# Patient Record
Sex: Female | Born: 2005 | Hispanic: No | Marital: Single | Smoking: Never smoker
Health system: Western US, Academic
[De-identification: ages and names within clinical notes are randomized; demographics above are authoritative.]

## PROBLEM LIST (undated history)

## (undated) DIAGNOSIS — L259 Unspecified contact dermatitis, unspecified cause: Secondary | ICD-10-CM

## (undated) DIAGNOSIS — J302 Other seasonal allergic rhinitis: Secondary | ICD-10-CM

## (undated) DIAGNOSIS — J45909 Unspecified asthma, uncomplicated: Secondary | ICD-10-CM

## (undated) DIAGNOSIS — L2084 Intrinsic (allergic) eczema: Secondary | ICD-10-CM

## (undated) HISTORY — DX: Unspecified asthma, uncomplicated: J45.909

## (undated) HISTORY — DX: Intrinsic (allergic) eczema: L20.84

## (undated) HISTORY — PX: NO SURGICAL HISTORY: 101002

## (undated) HISTORY — DX: Unspecified contact dermatitis, unspecified cause: L25.9

## (undated) HISTORY — DX: Other seasonal allergic rhinitis: J30.2

---
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
## 2023-01-01 ENCOUNTER — Ambulatory Visit: Payer: BLUE CROSS/BLUE SHIELD | Attending: ALLERGY

## 2023-01-01 DIAGNOSIS — J309 Allergic rhinitis, unspecified: Secondary | ICD-10-CM | POA: Insufficient documentation

## 2023-01-01 DIAGNOSIS — J45909 Unspecified asthma, uncomplicated: Secondary | ICD-10-CM | POA: Insufficient documentation

## 2023-01-01 NOTE — Nursing Note (Signed)
Pre-Procedure Immunotherapy Checklist:    All emergency medications are available in Pyxis (EpiPen, Cetirizine, Albuterol) yes    Two patient Identifiers confirmed: yes    Patient has their own EpiPen? yes    Patient's Premed Plan:   Singulair taken today no   Antihistamine taken at least 30 minutes prior to appointment yes   Other Zyrtec    Today's health status:  Peak flow (if asthmatic): NA    Any increased asthma symptoms in the past week? In the past 24 hours? no    Any increased allergy symptoms in the past week? Itching eyes/nose, sneezing, runny nose, post-nasal drip, throat clearing: no     Any cold/respiratory tract infections, or flu like symptoms in the past week? no    Any increased allergy/asthma symptoms, difficulty breathing, hives, or generalized itching within 12 hours of receiving your last injection? no    Any new medication (antibiotic, beta blocker, ACE-inhibitor): no     Today's antigen bottle confirmed: yes   Concentration: 1:1  Dose: 0.70ml - verified with SS, RN  Arm: Right    After 30 minute observation, patient's injection site(s) were checked for local reaction. Local reaction was normal and no other symptoms were noted. Patient left in stable condition. Instructed to avoid strenuous exercise for 2 hours after shot.

## 2023-01-29 ENCOUNTER — Ambulatory Visit: Payer: BLUE CROSS/BLUE SHIELD | Attending: ALLERGY

## 2023-01-29 DIAGNOSIS — J309 Allergic rhinitis, unspecified: Secondary | ICD-10-CM

## 2023-01-29 DIAGNOSIS — J45909 Unspecified asthma, uncomplicated: Secondary | ICD-10-CM | POA: Insufficient documentation

## 2023-01-29 NOTE — Progress Notes (Signed)
Pre-Procedure Immunotherapy Checklist:    All emergency medications are available in Pyxis (EpiPen, Cetirizine, Albuterol) yes    Two patient Identifiers confirmed: yes    Patient has their own EpiPen? yes    Patient's Premed Plan:   Singulair taken today no   Antihistamine taken at least 30 minutes prior to appointment yes   Other     Today's health status:  Peak flow (if asthmatic): NA    Any increased asthma symptoms in the past week? In the past 24 hours? no    Any increased allergy symptoms in the past week? Itching eyes/nose, sneezing, runny nose, post-nasal drip, throat clearing: no     Any cold/respiratory tract infections, or flu like symptoms in the past week? no    Any increased allergy/asthma symptoms, difficulty breathing, hives, or generalized itching within 12 hours of receiving your last injection? no    Any new medication (antibiotic, beta blocker, ACE-inhibitor): no     Today's antigen bottle confirmed: yes   Concentration: 1:1  Dose: 0.47ml  Arm: left    After 30 minute observation, patient's injection site(s) were checked for local reaction. Local reaction was normal and no other symptoms were noted. Patient left in stable condition. Instructed to avoid strenuous exercise for 2 hours after shot.

## 2023-02-24 NOTE — Progress Notes (Deleted)
Saw dr. Elba Barman did patch testing 08/2022    Date of patch testing: 05/30/2022  Results of patch testing:  Back:  Comprehensive series:   # 52, NA71, propolis, 2+     Bakery series:  # 2, HD476, ammonium persulfate, 2+  # 16, AP104, propyl gallate, 1+

## 2023-02-26 ENCOUNTER — Ambulatory Visit: Payer: BLUE CROSS/BLUE SHIELD | Attending: ALLERGY | Admitting: ALLERGY

## 2023-02-26 ENCOUNTER — Ambulatory Visit: Payer: BLUE CROSS/BLUE SHIELD

## 2023-02-26 VITALS — BP 96/58 | HR 72 | Temp 97.9°F | Ht 61.97 in | Wt 126.1 lb

## 2023-02-26 DIAGNOSIS — Z2989 Encounter for other specified prophylactic measures: Secondary | ICD-10-CM | POA: Insufficient documentation

## 2023-02-26 DIAGNOSIS — J309 Allergic rhinitis, unspecified: Secondary | ICD-10-CM | POA: Insufficient documentation

## 2023-02-26 DIAGNOSIS — H1010 Acute atopic conjunctivitis, unspecified eye: Secondary | ICD-10-CM | POA: Insufficient documentation

## 2023-02-26 DIAGNOSIS — Z9189 Other specified personal risk factors, not elsewhere classified: Secondary | ICD-10-CM | POA: Insufficient documentation

## 2023-02-26 DIAGNOSIS — L2089 Other atopic dermatitis: Secondary | ICD-10-CM | POA: Insufficient documentation

## 2023-02-26 NOTE — Progress Notes (Addendum)
Pediatric Allergy and Immunology Clinic Note    02/26/23    Dear Doctor Honeychurch:    I had the pleasure of seeing Taylor Conway in consultation in my Allergy clinic.  She is a 69yr female with a history of  allergic rhinitis, eczema  who presents for a chief complaint of allergic rhinitis.      HPI: Last seen over televisit 10/14/21    Allergic Rhinitis  Reached maintenance 05/2022 for T/G/Cat. Reports 80% improvement in symptoms. May use Zyrtec or Flonase occasionally, but in generally does not have to. Reports taking Zyrtec prior to her shots. Has only experienced local reactions, no systemic symptoms. Not as symptomatic around cats either.     Allergic Contact Dermatitis:  Reports getting rash on lips, torso, and legs. Had patch testing done with Dr. Elba Barman that was positive for preservatives in cosmetics and food products. After switching her self care products without these preservatives and becoming a vegetarian, she has no further issues.    Atopic Dermatitis: Well controlled on moisturizer.    Honey Reaction: can eat cooked honey, generally avoids fresh honey.    Atopic symptom review:  Conjunctivitis:  As above   Rhinitis:  As above   Atopic dermatitis/Rash:  Hx of infantile ecema, uses triamcinolone ointment as needed.   Asthma:  Denies   Food allergy/Reactivity:    Honey- she can eat them in products. If she eats straight honey, she will get itchy mouth and raised bumps in the upper lips. Raised bumps typically last ~1 week. However she can tolerate honey in baked products or cooked products.     I reviewed the patients medical history in the EMR.     Family history is significant for: Mom with food allergy and exercise-induced asthma (resolved); brother with food allergy and eczema     Environmental and social history is significant for: Living with in a dwelling with parents.      Home assessment: Yes No   Are there any smokers who smoke inside/outside the home?  x   Is there installed (wall-to-wall)  carpeting?  x   Is there central Air/Heat? x    Is there window unit Air/Heat?   x   Is there a swamp cooler in place?  x   Is there a HEPA filtration system in place?  x   Is there an Indoor fireplace/stove?  x   Any prior Mold issues in the dwelling?  x   Any pets? x      - If yes, what kind: dog. No problem with dog. With cat, she gets itchy on her face and nasal congestion.        Medications: reviewed in EMR    ROS: The review of systems was performed and reviewed in the patient questionnaire; all elements are negative or non-contributory except for the indicated pertinent positives noted above and below:    Review of Systems   Constitutional: Negative.    HENT: Negative.     Eyes: Negative.    Respiratory: Negative.     Cardiovascular: Negative.    Gastrointestinal: Negative.    Genitourinary: Negative.    Musculoskeletal: Negative.    Skin: Negative.    Allergic/Immunologic: Positive for environmental allergies.   Neurological: Negative.    Hematological: Negative.    Psychiatric/Behavioral: Negative.         Physical exam:   Vital signs: Normal breathing rate noted, actively moving around room  General appearance:  Appearance consistent with stated  age, comfortable, healthy  Head:  Nondysmorphic, atraumatic  Eyes: no scleral injection, conjunctiva normal  Ears: normal external inspection, appears to hear well  Nose: normal external inspection, no audible congestion  O/P: no facial angioedema, MMM  Neck:  Supple, mobile in all directions without pain  Respiratory/Lungs:  Normal breathing rate, no retractions noted, speaking comfortably, no cough  Skin:  Good color and tone, no rash, lesions or excoriations visible on exposed skin  Neuro:  Alert, moving all extremities  Psych:  Normal affect, happy,    Previous Laboratory Analysis/Testing:  Prior allergy testing notable for   2019    SPT in clinic 2022      Date of patch testing: 05/30/2022  Results of patch testing:  Back:  Comprehensive series:   # 52, NA71,  propolis, 2+     Bakery series:  # 2, HD476, ammonium persulfate, 2+  # 16, AP104, propyl gallate, 1+    Assessment   30yr female with a history of  allergic rhinitis, eczema  who presents for a chief complaint of allergy shots follow up.     Plan  1. Allergic Rhinitis. On SCIT for trees, grass, and cat. Significant improvement in symptoms, tolerating well without systemic reactions. Maintenance reached 05/2022- plan until 05/2025.   -continue Zyrtec prior to shots  -continue Flonase PRN  -can continue Zyrtec PRN if she finds this helpful  -continue SCIT for 3-5 years     2. 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 Continue to eat cooked honey products and avoid raw honey.     3. AD/contact dermatitis, controlled. Continue emollients and triamcinolone PRN. Avoid preservatives as instructed by Dr. Elba Barman    I would like to see [facility] for follow up in a year or sooner if needed    Education Topics Reviewed: Allergic Rhinitis, SCIT  Barriers to Learning: none    Patient/Family Understanding: Family agrees with plan      Thank you for very much for this referral.  Please call if you have any questions.        Barnett Abu, MD  Allergy & Clinical Immunology, PGY-5  [facility]        I reviewed the history and physical examination of this patient and discussed the patient's management with the fellow. I reviewed and edited the fellow's note and agree with the documented findings and plan of care.     Alcide Clever, MD/MPH  Assistant professor of Clinical Pediatrics  Immunology & Allergy  [facility]    I spent a total of 30 minutes today on this patient's visit. This included face to face time, along with review of the patient record (including but not limited to clinical notes, outside records, laboratory and radiographic studies), medical decision-making, and documentation  of the visit.

## 2023-02-26 NOTE — Progress Notes (Signed)
Pre-Procedure Immunotherapy Checklist:    All emergency medications are available in Pyxis (EpiPen, Cetirizine, Albuterol) yes    Two patient Identifiers confirmed: yes    Patient has their own EpiPen? yes    Patient's Premed Plan:   Singulair taken today yes   Antihistamine taken at least 30 minutes prior to appointment yes   Other n/a    Today's health status:  Peak flow (if asthmatic): n/a    Any increased asthma symptoms in the past week? In the past 24 hours? no    Any increased allergy symptoms in the past week? Itching eyes/nose, sneezing, runny nose, post-nasal drip, throat clearing: no     Any cold/respiratory tract infections, or flu like symptoms in the past week? no    Any increased allergy/asthma symptoms, difficulty breathing, hives, or generalized itching within 12 hours of receiving your last injection? no    Any new medication (antibiotic, beta blocker, ACE-inhibitor): no     Today's antigen bottle confirmed: yes   Concentration: 1:1  Dose: 0.33m  Arm: L    After 30 minute observation, patient's injection site(s) were checked for local reaction. Local reaction was normal and no other symptoms were noted. Patient left in stable condition. Instructed to avoid strenuous exercise for 2 hours after shot.

## 2023-02-26 NOTE — Nursing Note (Signed)
Patient accompanied  by self.  Vital signs taken, allergies verified,  screened for pain,  screened for chicken pox: had vaccine, and verified immunization status: up to date   Patient and parent(s) wearing mask.  Mask and face shield used  when rooming patient.  Leitha Schuller MA II

## 2023-02-27 ENCOUNTER — Encounter: Payer: Self-pay | Admitting: ALLERGY

## 2023-02-27 MED ORDER — CETIRIZINE 10 MG TABLET
10.0000 mg | ORAL_TABLET | Freq: Once | ORAL | 0 refills | Status: AC
Start: 2023-02-27 — End: 2024-02-22

## 2023-02-27 MED ORDER — EPINEPHRINE 0.3 MG/0.3 ML INJECTION, AUTO-INJECTOR
0.3000 mg | AUTO-INJECTOR | Freq: Once | INTRAMUSCULAR | 1 refills | Status: AC | PRN
Start: 2023-02-27 — End: 2024-02-22

## 2023-03-26 ENCOUNTER — Ambulatory Visit: Payer: BLUE CROSS/BLUE SHIELD | Attending: ALLERGY

## 2023-03-26 DIAGNOSIS — J309 Allergic rhinitis, unspecified: Secondary | ICD-10-CM | POA: Insufficient documentation

## 2023-03-26 DIAGNOSIS — J45909 Unspecified asthma, uncomplicated: Secondary | ICD-10-CM | POA: Insufficient documentation

## 2023-03-26 NOTE — Nursing Note (Signed)
Pre-Procedure Immunotherapy Checklist:    All emergency medications are available in Pyxis (EpiPen, Cetirizine, Albuterol) yes    Two patient Identifiers confirmed: yes    Patient has their own EpiPen? yes    Patient's Premed Plan:   Singulair taken today no   Antihistamine taken at least 30 minutes prior to appointment yes   Other     Today's health status:  Peak flow (if asthmatic): NA    Any increased asthma symptoms in the past week? In the past 24 hours? no    Any increased allergy symptoms in the past week? Itching eyes/nose, sneezing, runny nose, post-nasal drip, throat clearing: no     Any cold/respiratory tract infections, or flu like symptoms in the past week? no    Any increased allergy/asthma symptoms, difficulty breathing, hives, or generalized itching within 12 hours of receiving your last injection? no    Any new medication (antibiotic, beta blocker, ACE-inhibitor): no     Today's antigen bottle confirmed: yes   Concentration: 1:1  Dose: 0.22ml - verified with SS, RN  Arm: Left    After 30 minute observation, patient's injection site(s) were checked for local reaction. Local reaction was normal and no other symptoms were noted. Patient left in stable condition. Instructed to avoid strenuous exercise for 2 hours after shot.

## 2023-03-30 ENCOUNTER — Encounter: Payer: Self-pay | Admitting: Dermatology

## 2023-03-30 ENCOUNTER — Encounter: Payer: Self-pay | Admitting: ALLERGY

## 2023-03-30 DIAGNOSIS — H1010 Acute atopic conjunctivitis, unspecified eye: Secondary | ICD-10-CM

## 2023-03-30 DIAGNOSIS — L2089 Other atopic dermatitis: Secondary | ICD-10-CM

## 2023-03-30 MED ORDER — AZELASTINE 137 MCG-FLUTICASONE 50 MCG/SPRAY NASAL SPRAY
1.0000 | Freq: Two times a day (BID) | NASAL | 3 refills | Status: DC
Start: 2023-03-30 — End: 2024-06-23

## 2023-03-30 MED ORDER — FLUTICASONE PROPIONATE 50 MCG/ACTUATION NASAL SPRAY,SUSPENSION
1.0000 | Freq: Every day | NASAL | 3 refills | Status: DC
Start: 2023-03-30 — End: 2024-06-23

## 2023-03-30 NOTE — Telephone Encounter (Signed)
Refill request received for   Requested Prescriptions     Pending Prescriptions Disp Refills    azelastine-fluticasone (DYMISTA) 137-50 mcg/spray Non-Aerosol Spray 23 g 3     Sig: Spray 1 puff into each nostril 2 times daily.     from [facility].    Last visit with Dr. Cyndie Chime on 02/26/23.    Refill sent to MD for approval.    Doren Custard, RN

## 2023-03-30 NOTE — Telephone Encounter (Signed)
Patient last office visit on 01/30/2022  Last filled 09/03/2022

## 2023-04-01 MED ORDER — TRIAMCINOLONE ACETONIDE 0.1 % TOPICAL OINTMENT
TOPICAL_OINTMENT | Freq: Two times a day (BID) | TOPICAL | 0 refills | Status: DC
Start: 2023-04-01 — End: 2024-06-23

## 2023-04-21 ENCOUNTER — Telehealth: Payer: Self-pay | Admitting: ALLERGY

## 2023-04-21 NOTE — Telephone Encounter (Signed)
Received incoming call from: patient Taylor Conway [phone]       Details of Problem/Request: patient called today needing to reschedule tomorrows shot appointment. Patient is in school so the best time to call her to reschedule is 1:00 pm or 3:00 pm.       Thank you  Orion Crook  PSR II

## 2023-04-22 ENCOUNTER — Ambulatory Visit: Payer: BLUE CROSS/BLUE SHIELD

## 2023-04-22 NOTE — Telephone Encounter (Signed)
Called patient back, rescheduled her allergy shot for 5/8.

## 2023-04-22 NOTE — Telephone Encounter (Signed)
Called patient and rescheduled.

## 2023-04-23 ENCOUNTER — Ambulatory Visit: Payer: BLUE CROSS/BLUE SHIELD

## 2023-04-29 ENCOUNTER — Ambulatory Visit: Payer: BLUE CROSS/BLUE SHIELD | Attending: ALLERGY

## 2023-04-29 DIAGNOSIS — J301 Allergic rhinitis due to pollen: Secondary | ICD-10-CM | POA: Insufficient documentation

## 2023-04-29 DIAGNOSIS — J3089 Other allergic rhinitis: Secondary | ICD-10-CM | POA: Insufficient documentation

## 2023-04-29 DIAGNOSIS — J4520 Mild intermittent asthma, uncomplicated: Secondary | ICD-10-CM | POA: Insufficient documentation

## 2023-04-29 DIAGNOSIS — J3081 Allergic rhinitis due to animal (cat) (dog) hair and dander: Secondary | ICD-10-CM | POA: Insufficient documentation

## 2023-04-29 NOTE — Progress Notes (Signed)
Pre-Procedure Immunotherapy Checklist:    All emergency medications are available in Pyxis (EpiPen, Cetirizine, Albuterol) yes    Two patient Identifiers confirmed: yes    Patient has their own EpiPen? yes    Patient's Premed Plan:   Singulair taken today yes   Antihistamine taken at least 30 minutes prior to appointment yes   Other     Today's health status:  Peak flow (if asthmatic): NA    Any increased asthma symptoms in the past week? In the past 24 hours? no    Any increased allergy symptoms in the past week? Itching eyes/nose, sneezing, runny nose, post-nasal drip, throat clearing: yes - Notified Dr. Threasa Heads, patient having eye itching and sneezing this week, overall better today. Okay to give.     Any cold/respiratory tract infections, or flu like symptoms in the past week? no    Any increased allergy/asthma symptoms, difficulty breathing, hives, or generalized itching within 12 hours of receiving your last injection? no    Any new medication (antibiotic, beta blocker, ACE-inhibitor): no     Today's antigen bottle confirmed: yes   Concentration: 1:1  Dose: 0.51ml  - verified with SS,RN  Arm: right    After 30 minute observation, patient's injection site(s) were checked for local reaction. Local reaction was normal and no other symptoms were noted. Patient left in stable condition. Instructed to avoid strenuous exercise for 2 hours after shot.

## 2023-06-01 ENCOUNTER — Ambulatory Visit: Payer: BLUE CROSS/BLUE SHIELD | Attending: ALLERGY

## 2023-06-01 DIAGNOSIS — J4520 Mild intermittent asthma, uncomplicated: Secondary | ICD-10-CM | POA: Insufficient documentation

## 2023-06-01 DIAGNOSIS — J3081 Allergic rhinitis due to animal (cat) (dog) hair and dander: Secondary | ICD-10-CM | POA: Insufficient documentation

## 2023-06-01 DIAGNOSIS — J301 Allergic rhinitis due to pollen: Secondary | ICD-10-CM | POA: Insufficient documentation

## 2023-06-01 NOTE — Progress Notes (Signed)
Pre-Procedure Immunotherapy Checklist:    All emergency medications are available in Pyxis (EpiPen, Cetirizine, Albuterol) yes    Two patient Identifiers confirmed: yes    Patient has their own EpiPen? yes    Patient's Premed Plan:   Singulair taken today no   Antihistamine taken at least 30 minutes prior to appointment yes   Other     Today's health status:  Peak flow (if asthmatic): NA    Any increased asthma symptoms in the past week? In the past 24 hours? no    Any increased allergy symptoms in the past week? Itching eyes/nose, sneezing, runny nose, post-nasal drip, throat clearing: no     Any cold/respiratory tract infections, or flu like symptoms in the past week? no    Any increased allergy/asthma symptoms, difficulty breathing, hives, or generalized itching within 12 hours of receiving your last injection? no    Any new medication (antibiotic, beta blocker, ACE-inhibitor): no     Today's antigen bottle confirmed: yes   Concentration: 1:1  Dose: 0.5ml - verified with SS, RN  Arm: Left    After 30 minute observation, patient's injection site(s) were checked for local reaction. Local reaction was normal and no other symptoms were noted. Patient left in stable condition. Instructed to avoid strenuous exercise for 2 hours after shot.

## 2023-06-03 ENCOUNTER — Ambulatory Visit: Payer: BLUE CROSS/BLUE SHIELD

## 2023-06-09 NOTE — Telephone Encounter (Signed)
Called MOC- let her know that I left a message for Anevay in her MyChart about her allergy shots. MOC stated she will let Reygan know to read it and message me back.

## 2023-07-02 ENCOUNTER — Ambulatory Visit: Payer: BLUE CROSS/BLUE SHIELD

## 2023-07-17 ENCOUNTER — Encounter: Payer: Self-pay | Admitting: DERMATOLOGY

## 2023-07-17 DIAGNOSIS — L709 Acne, unspecified: Secondary | ICD-10-CM

## 2023-07-17 MED ORDER — SULFACETAMIDE SODIUM-SULFUR 10 %-5 % (W/W) TOPICAL CLEANSER
Freq: Every day | TOPICAL | 6 refills | Status: DC
Start: 2023-07-17 — End: 2024-06-23

## 2023-07-17 NOTE — Telephone Encounter (Signed)
Hello Dr. Dorna Bloom,    Patient is requesting a New Rx for Sulfacetamide Sodium-Sulfur 10-5% (w/w) Cleanser. It looks like you Rx'd it for her in the past but she is requesting a new pharmacy: [facility] home delivery.     Please Advise if ok & prescribe. Thank you. Lisabeth Devoid, LVN

## 2023-07-17 NOTE — Telephone Encounter (Signed)
Requested Prescriptions     Signed Prescriptions Disp Refills    Sulfacetamide Sodium-Sulfur 10-5 % (w/w) Cleanser 340 g 6     Sig: Apply to the affected area every day.     Authorizing Provider: Purnell Shoemaker MELISSA     Ordering User: Adela Ports     RX sent to [facility] pharmacy so patient can purchase out of pocket.     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 A scraping at prior visit showed demodex in the area and she was given topical metronidazole, ivermectin and sulfur wash. This helped significantly. She flared again recently when she ran out of the sulfur wash; now that she has it again, it is improving.  Exam:  Few small pink papules involving the upper cutaneous lip, perialar area, and chin. Much improved compared to photos from recent flare.  Plan:    - Continue sulfacetamide cleanser nightly. Refilled today. "    Last Derm appt: 11/10/2022  Next Derm appt: None    Prescription filled per Prescription Refill by Clinic Registered Nurse Standardized Procedure.     Adela Ports, RN, BSN  [facility]  [phone]

## 2023-12-23 ENCOUNTER — Encounter: Payer: Self-pay | Admitting: Dermatology

## 2023-12-23 DIAGNOSIS — L442 Lichen striatus: Secondary | ICD-10-CM

## 2023-12-23 DIAGNOSIS — L2082 Flexural eczema: Secondary | ICD-10-CM

## 2023-12-28 ENCOUNTER — Encounter: Payer: Self-pay | Admitting: Dermatology

## 2023-12-29 NOTE — Telephone Encounter (Signed)
Service: Dorna Bloom  Requested Prescriptions     Refused Prescriptions Disp Refills    Tacrolimus (PROTOPIC) 0.1 % Ointment 30 g 2     Sig: Apply to the affected area 2 times daily. to lips      Last visit: 11/10/22     Background: over one year since last appointment      Future visit: none: mychart message sent to patient     Unable to refill prescription per RN prescription refill protocol. Deferring to provider for review.     Charlcie Cradle, RN  Ambulatory RN Float Pool  Please do not route to me personally, I am a float nurse  Please send all responses to [facility] Nursing pool

## 2024-04-01 ENCOUNTER — Encounter: Payer: Self-pay | Admitting: Internal Medicine

## 2024-04-01 ENCOUNTER — Ambulatory Visit: Admitting: Internal Medicine

## 2024-04-01 VITALS — BP 108/59 | HR 74 | Temp 97.7°F | Resp 12 | Ht 62.0 in | Wt 125.4 lb

## 2024-04-01 DIAGNOSIS — Z7689 Persons encountering health services in other specified circumstances: Secondary | ICD-10-CM

## 2024-04-01 DIAGNOSIS — R5383 Other fatigue: Secondary | ICD-10-CM

## 2024-04-01 DIAGNOSIS — F419 Anxiety disorder, unspecified: Secondary | ICD-10-CM

## 2024-04-01 NOTE — Progress Notes (Signed)
 Chief Complaint   Patient presents with    Tired       ASSESSMENT & PLAN       Diagnoses and all orders for this visit:  Fatigue, unspecified type  -     Comprehensive Metabolic Panel; Future  -     CBC with Differential; Future  -     Vitamin B12; Future  -     Folate; Future  -     TSH with Free T4 Reflex; Future  -     Ferritin; Future  Anxiety    18yo F presenting for fatigue, est care.  Reviewed past medical, social, family histories.  Reports 2 month hx of fatigue, no significant bleed history although has had iron deficiency in the past. Also expressed concern about hair thinning at temporal regions.  Ddx hypothyroid, anemia, lack of sleep, vitamin deficiency.  Will obtain above labs and eval for metabolic causes of fatigue.  Also recommended that patient engage in counseling services; discussed medication therapy, but patient would like to defer for now until next follow-up. Plans to travel abroad later this fall.       HPI      Taylor Conway is a 67yr female [place], asthma, vegetarianism who is presenting for fatigue.    Two months ago started to experience brain fog, low energy, lightheaded spells with working out. Denies any pre-syncopal symptoms.   Endorses cold intolerance.  Describes some hair loss around the hair lines.  No routine change, but has been more stressed from work/school.  Denies diarrhea, constipation.    LMP was last week of march, lasting 3-5 days, uses about 5 pads/day on heaviest.    3 years ago had borderline anemia, had used iron pills/tablets.  Mom has hx of VTE x 4 days, also became vegetarian recently and started blood transfusion.    Takes iron tablets daily, doesn't recall dosage, and hair MVI including B12.    We also discussed about her schoolwork, that she is involved in many facets of college life including double majoring, working, researching, and also participating in two extra-curriculars. She has as tendency to be restless or to continuously stay active. Endorses feelings of  anxiety and worries about performing. GAD7 of 19.    Problem List[1]    Click to View Meds Ordered Prior to this Encounter[2]      OBJECTIVE      Her height is 1.575 m (5\' 2" ) and weight is 56.9 kg (125 lb 7.1 oz). Her temporal temperature is 36.5 C (97.7 F). Her blood pressure is 108/59 and her pulse is 74. Her respiration is 12 and oxygen saturation is 100%.     Physical Exam  Eyes:      Conjunctiva/sclera: Conjunctivae normal.      Pupils: Pupils are equal, round, and reactive to light.      Comments: No conjunctival pallor.   Cardiovascular:      Rate and Rhythm: Normal rate and regular rhythm.   Pulmonary:      Effort: Pulmonary effort is normal.      Breath sounds: Normal breath sounds.   Skin:     General: Skin is warm and dry.   Neurological:      General: No focal deficit present.      Mental Status: She is alert.           Electronically Signed By:    Randel Pigg, MD  Internal Medicine / Pediatrics  Associate Physician  [place]  [facility]       [1]   Patient Active Problem List  Diagnosis    Medication Therapy Auth    Contact dermatitis   [2]   Current Outpatient Medications on File Prior to Visit   Medication Sig Dispense Refill    azelastine-fluticasone (DYMISTA) 137-50 mcg/spray Non-Aerosol Spray Spray 1 puff into each nostril 2 times daily. 23 g 3    Cetirizine (ZYRTEC) 10 mg Tablet Take 1 tablet by mouth one time. 1 tablet 0    Epinephrine, Anaphylaxis, (EPIPEN) 0.3 mg/0.3 mL Injection Inject 0.3 mL into the muscle one time if needed for allergic reaction. 2 each 1    Fluticasone (FLONASE) 50 mcg/actuation nasal spray Instill 1 spray into EACH nostril every day. 16 g 3    Mometasone (ELOCON) 0.1 % Ointment Apply sparingly twice a day as needed to affected areas for up to 10 days 15 g 0    Olopatadine (PATANOL) 0.1 % Ophthalmic Solution Instill 1 drop into EACH eye two times daily if needed for Allergies. 5 mL 3    Sulfacetamide Sodium-Sulfur 10-5 % (w/w) Cleanser Apply to the affected  area every day. 340 g 6    Tacrolimus (PROTOPIC) 0.1 % Ointment Apply to the affected area 2 times daily. to lips 30 g 2    Triamcinolone (KENALOG) 0.1 % Ointment Apply to the affected area 2 times daily. to neck 30 g 0     No current facility-administered medications on file prior to visit.

## 2024-04-01 NOTE — Progress Notes (Deleted)
 32yr female with a history of  allergic rhinitis, eczema  who presents  Had immunologist for allergy shots, but has not had it in a while        Get overwhelm more foten, more irritable, stress has cause this,  Some days has racing htought      I feel fluctuating levels of fatigue, experience brain fog, and I struggle to regulate my body temperature at times,minor hair loss and have light-headed spells. I was looking to see if I was experiencing any vitamin deficiencies by getting a blood panel test.  Feels like it was impediing her ability to focus in work    Typically 6-7 hours  Hard for her to stay asleep, has not slep thoruhgout the night ocnsistenlty throught hte night  Sometimes naps during day 10-80min  Never feels refreshed    Wakes up at 6-7 am and needs a nap at 11am   Does not feel concentrated in class  Eneergy levels affects her most    Secondary issues -> brain fog  Restless, forgetful  Working out -> dizzy spells, light headed, fine after a few seconds  Notices hairloss at edges of hair, hair would come off with just slight   Not sure if this is related to stress with school and work     Fatigue is new,     Appetite fluctuates (stress eats more)    Intolerance to cold    Denies near syncope  No irregular peroid    Had anemia about 3 years ago, dx with borderline anemia and given iron pills  Mom has coagulapathy issues and on vegaterian diet and needed blood transfusion due to low levvels of iron      Been vegetarian for 1.5 years, pescatarian year before that     Supplements: Iron, Hair multivitamin (vit A, C, D, E, thiamine, B12 mcg, biotin)    LMP: March 16  3-6 days long, 4-5 pads per day     Denies diarrhea  Denis,   Hairloss on head  Denies heavy period or change sin period   Denies weight changes    Never feels refreshed after sleep    Always been hard to regulate body, cold rather than hot    Started 2 months ago    Vegatarian since [place]

## 2024-04-01 NOTE — Nursing Note (Signed)
 Patient verified by (2) identifiers, vitals taken, screened for pain, and allergies verified.     Roomed with self.    Quality Metric Screening completed?    *Advance Care Planning:   Not Applicable    *Diabetes Retinopathy Screening:   Not Applicable    *Chlamydia and Gonorrhea Screening for female patients aged 20-63 years old:   Not Applicable    Johny Chess, LVN

## 2024-04-05 ENCOUNTER — Ambulatory Visit: Attending: Family Medicine

## 2024-04-05 ENCOUNTER — Ambulatory Visit: Payer: Self-pay | Admitting: Internal Medicine

## 2024-04-05 DIAGNOSIS — R5383 Other fatigue: Secondary | ICD-10-CM | POA: Insufficient documentation

## 2024-04-05 LAB — CBC WITH DIFFERENTIAL
Basophils % Auto: 0.8 %
Basophils Abs Auto: 0 10*3/uL (ref 0.0–0.2)
Eosinophils % Auto: 4.9 %
Eosinophils Abs Auto: 0.2 10*3/uL (ref 0.0–0.5)
Hematocrit: 39.2 % (ref 36.0–46.0)
Hemoglobin: 13 g/dL (ref 12.0–16.0)
Lymphocytes % Auto: 31.9 %
Lymphocytes Abs Auto: 1.5 10*3/uL (ref 1.2–5.2)
MCH: 31.3 pg (ref 27.0–33.0)
MCHC: 33 % (ref 32.0–36.0)
MCV: 94.9 fL (ref 80.0–100.0)
MPV: 8.8 fL (ref 6.8–10.0)
Monocytes % Auto: 6.3 %
Monocytes Abs Auto: 0.3 10*3/uL (ref 0.1–0.8)
Neutrophils % Auto: 56.1 %
Neutrophils Abs Auto: 2.7 10*3/uL (ref 1.8–8.0)
Platelet Count: 265 10*3/uL (ref 130–400)
RDW: 12.7 % (ref 0.0–14.7)
Red Blood Cell Count: 4.13 10*6/uL (ref 4.10–5.10)
White Blood Cell Count: 4.7 10*3/uL (ref 4.5–13.0)

## 2024-04-05 LAB — COMPREHENSIVE METABOLIC PANEL
Alanine Transferase (ALT): 21 U/L (ref <=33)
Albumin: 4.4 g/dL (ref 4.0–4.9)
Alkaline Phosphatase (ALP): 68 U/L (ref 35–129)
Anion Gap: 12 mmol/L (ref 7–15)
Aspartate Transaminase (AST): 22 U/L (ref <=41)
Bilirubin Total: 0.5 mg/dL (ref <=1.2)
Calcium: 9.4 mg/dL (ref 8.6–10.0)
Carbon Dioxide Total: 23 mmol/L (ref 22–29)
Chloride: 103 mmol/L (ref 98–107)
Creatinine Serum: 0.85 mg/dL (ref 0.51–1.17)
E-GFR Creatinine (Female): >100 mL/min/{1.73_m2}
Glucose: 92 mg/dL (ref 74–109)
Potassium: 4.8 mmol/L (ref 3.4–5.1)
Protein: 7.2 g/dL (ref 6.6–8.7)
Sodium: 138 mmol/L (ref 136–145)
Urea Nitrogen, Blood (BUN): 11 mg/dL (ref 6–20)

## 2024-04-05 LAB — VITAMIN B12: Vitamin B12: 611 pg/mL (ref 213–816)

## 2024-04-05 LAB — FOLATE: Folate: >20 ng/mL (ref >=7.0)

## 2024-04-05 LAB — TSH WITH FREE T4 REFLEX: Thyroid Stimulating Hormone: 0.54 u[IU]/mL (ref 0.27–4.20)

## 2024-04-05 LAB — FERRITIN: Ferritin: 40 ng/mL (ref 13–150)

## 2024-05-02 ENCOUNTER — Ambulatory Visit: Admitting: Family Medicine

## 2024-05-02 ENCOUNTER — Encounter: Payer: Self-pay | Admitting: Family Medicine

## 2024-05-02 ENCOUNTER — Ambulatory Visit: Admitting: INTERNAL MEDICINE

## 2024-05-02 VITALS — BP 106/58 | HR 85 | Temp 97.9°F | Wt 125.2 lb

## 2024-05-02 DIAGNOSIS — Z0000 Encounter for general adult medical examination without abnormal findings: Secondary | ICD-10-CM

## 2024-05-02 NOTE — Patient Instructions (Signed)
INSTRUCTIONS FROM YOUR DOCTOR:  - Start over-the-counter Loratadine or Cetirizine for allergies (get at [employer], called Aller-Clear and Aller-Tec, respectively)

## 2024-05-02 NOTE — Progress Notes (Signed)
 History of Present Illness:  Taylor Conway is a 18yr old female who presents for WWE.     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  She will be taking a gap year from [employer].    ROS:  Constitutional: negative.  Eyes: negative.  Ears, Nose, Mouth, Throat: negative.  CV: negative.  Resp: negative.  GI: negative.  GU: negative.  Musculoskeletal: negative.  Integumentary: negative.  Neuro: negative.  Psych: Mood pt's report, euthymic.  Endo: negative.  Heme/Lymphatic: negative.  Allergy /Immun: seasonal allergies, ameliorated with Zyrtec   GYN: negative.    OB History   Gravida Para Term Preterm AB Living   0 <na> <na> <na> <na> <na>   SAB IAB Ectopic Multiple Live Births   <na> <na> <na> <na> <na>         Past Medical History[1]    Current Outpatient Medications   Medication Sig    azelastine -fluticasone  (DYMISTA ) 137-50 mcg/spray Non-Aerosol Spray Spray 1 puff into each nostril 2 times daily.    Cetirizine  (ZYRTEC ) 10 mg Tablet Take 1 tablet by mouth one time.    Epinephrine , Anaphylaxis, (EPIPEN ) 0.3 mg/0.3 mL Injection Inject 0.3 mL into the muscle one time if needed for allergic reaction.    Fluticasone  (FLONASE ) 50 mcg/actuation nasal spray Instill 1 spray into EACH nostril every day.    Mometasone  (ELOCON) 0.1 % Ointment Apply sparingly twice a day as needed to affected areas for up to 10 days    Olopatadine  (PATANOL) 0.1 % Ophthalmic Solution Instill 1 drop into EACH eye two times daily if needed for Allergies.    Sulfacetamide  Sodium-Sulfur  10-5 % (w/w) Cleanser Apply to the affected area every day.    Tacrolimus  (PROTOPIC ) 0.1 % Ointment Apply to the affected area 2 times daily. to lips    Triamcinolone  (KENALOG ) 0.1 % Ointment Apply to the affected area 2 times daily. to neck     No current facility-administered medications for this visit.       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 Has a 10 yrs older brother.         Diet: vegetarian    Exercise: goes to gym 6 times a week (weight lifting)     Social Drivers of Psychologist, prison and probation services Strain: Not on file   Food Insecurity: Not on file   Transportation Needs: Not on file   Physical Activity: Not on file   Stress: Not on file   Social Connections: Unknown (05/06/2022)    Received from [facility]    Social Network     Social Network: Not on file  Intimate Partner Violence: Unknown (03/28/2022)    Received from [facility]    HITS     Physically Hurt: Not on file     Insult or Talk Down To: Not on file     Threaten Physical Harm: Not on file     Scream or Curse: Not on file   Housing Stability: Not on file          OBJECTIVE:   BP 106/58 (SITE: left arm, Orthostatic Position: sitting, Cuff Size: regular)   Pulse 85   Temp 36.6 C (97.9 F)   Wt 56.8 kg (125 lb 3.5 oz)   SpO2 97%      Physical Exam:   Gen: Pleasant adult female in no acute distress   Eyes: normal, PERRLA and EOM's intact  HENT: within normal limits  Neck:  supple, no adenopathy, thyroid  normal  Heart: normal, regular rate and rhythm, no murmurs/rubs/gallops  Lungs: normal, clear to auscultation  Abdomen: abdomen soft, non-tender, BS normal, no masses or HSM  Skin: no evidence of acne or abnormal hirsuitism  Psych: mood and affect were appropriate     Pelvic Exam: deferred, no concerns      ASSESSMENT and PLAN:  (Z00.00) Routine history and  physical examination of adult  (primary encounter diagnosis)  Comment: Normal exam, except as noted below.  Plan:  - Cervical cancer screening: will be due when 19 and sexually active  - Routine labs: see orders  - Vaccines: see orders  - Patient instructed on: diet, exercise, immunizations.  - Forms for travel abroad filled out, reviewed for accuracy with patient, submitted for scanning  - Return in 1 year or sooner if problems should occur.      I did review patient's past medical and family/social history, no changes noted.    Electronically Signed By:  Colbert Dates, MD  Associate Physician  [facility]         [1]   Past Medical History:  Diagnosis Date    Asthma     Stopped using a steroid inhaler because it was causing an "autoimmune" response. Has more exercise induced asthma. No hospitalizations or intubations    Contact dermatitis     Seasonal allergies 2007   [2]   Family History  Problem Relation Name Age of Onset    Other (coagulopathy) Mother Thermon Flattery         VTE x 4 - unexplained    Asthma Mother Latrice [place]     No Known Problems Father      No Known Problems Brother      Heart Disease Maternal Grandmother      Diabetes Maternal Grandfather      Stroke Paternal Grandfather      Heart Attack Paternal Grandfather

## 2024-05-02 NOTE — Nursing Note (Signed)
 Vital signs taken, allergies verified, and screened for pain.    Modesta Messing, MA

## 2024-05-03 ENCOUNTER — Encounter: Payer: Self-pay | Admitting: Family Medicine

## 2024-05-03 NOTE — Telephone Encounter (Signed)
 Placed in MD's in box.       Angelene Kelly, MA

## 2024-05-03 NOTE — Telephone Encounter (Signed)
 Forms Request  Forwarding to Clinical staff to address.     [MA:  Create an Encounter and use .MCFORMSREQUEST]

## 2024-05-04 NOTE — Telephone Encounter (Signed)
 This was addressed  at appointment on 05/02/2024

## 2024-06-02 ENCOUNTER — Encounter: Payer: Self-pay | Admitting: Internal Medicine

## 2024-06-02 DIAGNOSIS — R5383 Other fatigue: Secondary | ICD-10-CM

## 2024-06-02 NOTE — Telephone Encounter (Signed)
 Forwarding to Provider to review and respond to patient.  Requesting Vit D lab.       Recent Visits  Date Type Provider Dept   05/02/24 Office Visit Sallee Craw, MD [facility]   04/01/24 Office Visit Li, John Xuelin, MD [facility]   Showing recent visits within past 540 days with a meds authorizing provider and meeting all other requirements  Future Appointments  Date Type Provider Dept   06/23/24 Appointment Li, John Xuelin, MD [facility]   Showing future appointments within next 150 days with a meds authorizing provider and meeting all other requirements

## 2024-06-23 ENCOUNTER — Encounter: Payer: Self-pay | Admitting: Internal Medicine

## 2024-06-23 ENCOUNTER — Ambulatory Visit: Admitting: Internal Medicine

## 2024-06-23 VITALS — BP 106/68 | HR 64 | Ht 62.5 in | Wt 126.3 lb

## 2024-06-23 DIAGNOSIS — J309 Allergic rhinitis, unspecified: Secondary | ICD-10-CM

## 2024-06-23 DIAGNOSIS — L709 Acne, unspecified: Secondary | ICD-10-CM

## 2024-06-23 DIAGNOSIS — L2089 Other atopic dermatitis: Secondary | ICD-10-CM

## 2024-06-23 DIAGNOSIS — Z23 Encounter for immunization: Secondary | ICD-10-CM

## 2024-06-23 DIAGNOSIS — Z7689 Persons encountering health services in other specified circumstances: Secondary | ICD-10-CM

## 2024-06-23 MED ORDER — SULFACETAMIDE SODIUM-SULFUR 10 %-5 % (W/W) TOPICAL CLEANSER
Freq: Every day | TOPICAL | 6 refills | Status: DC
Start: 2024-06-23 — End: 2024-07-11

## 2024-06-23 MED ORDER — TRIAMCINOLONE ACETONIDE 0.1 % TOPICAL OINTMENT
TOPICAL_OINTMENT | Freq: Two times a day (BID) | TOPICAL | 0 refills | Status: DC
Start: 2024-06-23 — End: 2024-07-11

## 2024-06-23 MED ORDER — FLUTICASONE PROPIONATE 50 MCG/ACTUATION NASAL SPRAY,SUSPENSION
1.0000 | Freq: Every day | NASAL | 3 refills | Status: AC
Start: 2024-06-23 — End: 2025-06-18

## 2024-06-23 NOTE — Nursing Note (Signed)
 Patient verified by (2) identifiers, vitals taken, screened for pain, and allergies verified.     Taylor Brunke, MA

## 2024-06-23 NOTE — Progress Notes (Signed)
 Chief Complaint   Patient presents with    Establish Care       ASSESSMENT & PLAN       Diagnoses and all orders for this visit:  Establishing care with new doctor, encounter for  Acne, unspecified acne type  -     Sulfacetamide  Sodium-Sulfur  10-5 % (w/w) Cleanser; Apply to the affected area every day.  Flexural atopic dermatitis  -     Triamcinolone  (KENALOG ) 0.1 % Ointment; Apply to the affected area 2 times daily. to neck  Allergic rhinitis, unspecified seasonality, unspecified trigger  -     Fluticasone  (FLONASE ) 50 mcg/actuation nasal spray; Instill 1 spray into EACH nostril every day.  Other orders  -     MENINGOCOCCAL B, OMV (BEXSERO ) VACCINE, IM    77yr female who is presenting for TOC.  Reviewed past medical, social, family histories.  Updated histories.  Refilled medication for acne, eczema, and AR.  Men B up to date today.    Modified Medications    Modified Medication Previous Medication    FLUTICASONE  (FLONASE ) 50 MCG/ACTUATION NASAL SPRAY Fluticasone  (FLONASE ) 50 mcg/actuation nasal spray       Instill 1 spray into EACH nostril every day.    Instill 1 spray into EACH nostril every day.    SULFACETAMIDE  SODIUM-SULFUR  10-5 % (W/W) CLEANSER Sulfacetamide  Sodium-Sulfur  10-5 % (w/w) Cleanser       Apply to the affected area every day.    Apply to the affected area every day.    TRIAMCINOLONE  (KENALOG ) 0.1 % OINTMENT Triamcinolone  (KENALOG ) 0.1 % Ointment       Apply to the affected area 2 times daily. to neck    Apply to the affected area 2 times daily. to neck        HPI      Taylor Conway is a 36yr female who is presenting for TOC.    Reports that fatigue is better, also sleeping more than before.  Has reintroduced meat back into diet and feels that has helped.  Has had some hair loss, may be due to stress.    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  In the fall will plan to go to [place] for a year ([place]) gap year for [employer].    Problem List[1]    Click to View Meds  Ordered Prior to this Encounter[2]      OBJECTIVE      Her height is 1.588 m (5' 2.5) and weight is 57.3 kg (126 lb 5.2 oz). Her blood pressure is 106/68 and her pulse is 64.     Physical Exam  Cardiovascular:      Rate and Rhythm: Normal rate.   Pulmonary:      Effort: Pulmonary effort is normal.   Skin:     General: Skin is warm and dry.   Neurological:      General: No focal deficit present.      Mental Status: She is alert.           Electronically Signed By:    Durelle Zepeda Xuelin Mena Simonis, MD  Internal Medicine / Pediatrics  Associate Physician  [facility]       [1]   Patient Active Problem List  Diagnosis    Medication Therapy Auth    Contact dermatitis   [2]   Current Outpatient Medications on File Prior to Visit   Medication Sig Dispense Refill    Cetirizine  (  ZYRTEC ) 10 mg Tablet Take 1 tablet by mouth one time. 1 tablet 0    Epinephrine , Anaphylaxis, (EPIPEN ) 0.3 mg/0.3 mL Injection Inject 0.3 mL into the muscle one time if needed for allergic reaction. 2 each 1    Mometasone  (ELOCON) 0.1 % Ointment Apply sparingly twice a day as needed to affected areas for up to 10 days 15 g 0    Olopatadine  (PATANOL) 0.1 % Ophthalmic Solution Instill 1 drop into EACH eye two times daily if needed for Allergies. 5 mL 3    Tacrolimus  (PROTOPIC ) 0.1 % Ointment Apply to the affected area 2 times daily. to lips 30 g 2     No current facility-administered medications on file prior to visit.

## 2024-06-23 NOTE — Nursing Note (Signed)
 Patient verified by (2) identifiers, and allergies verified.     After obtaining verbal informed consent.    Immunization VIS documentation(s) were given to patient to review. All questions were answered and the patient consented to the Immunization(s) being given. Patient allergies were reviewed and no contraindications were found. The Bexero immunization(s) were given as ordered. The patient was observed for any immediate reactions to the vaccine. None were observed.    Patient tolerated injection well.     Kiran Lapine, MA

## 2024-07-11 ENCOUNTER — Ambulatory Visit: Admitting: Dermatology

## 2024-07-11 ENCOUNTER — Other Ambulatory Visit: Payer: Self-pay

## 2024-07-11 DIAGNOSIS — L2082 Flexural eczema: Secondary | ICD-10-CM

## 2024-07-11 DIAGNOSIS — L442 Lichen striatus: Secondary | ICD-10-CM

## 2024-07-11 DIAGNOSIS — L710 Perioral dermatitis: Secondary | ICD-10-CM

## 2024-07-11 DIAGNOSIS — L659 Nonscarring hair loss, unspecified: Secondary | ICD-10-CM

## 2024-07-11 DIAGNOSIS — L709 Acne, unspecified: Secondary | ICD-10-CM

## 2024-07-11 DIAGNOSIS — Z872 Personal history of diseases of the skin and subcutaneous tissue: Secondary | ICD-10-CM

## 2024-07-11 DIAGNOSIS — L2089 Other atopic dermatitis: Secondary | ICD-10-CM

## 2024-07-11 MED ORDER — TACROLIMUS 0.1 % TOPICAL OINTMENT
TOPICAL_OINTMENT | TOPICAL | 4 refills | Status: AC
Start: 2024-07-11 — End: 2025-07-11

## 2024-07-11 MED ORDER — TRIAMCINOLONE ACETONIDE 0.1 % TOPICAL OINTMENT
TOPICAL_OINTMENT | TOPICAL | 4 refills | Status: AC
Start: 2024-07-11 — End: 2025-07-11

## 2024-07-11 MED ORDER — METRONIDAZOLE 0.75 % TOPICAL CREAM
TOPICAL_CREAM | TOPICAL | 4 refills | Status: AC
Start: 2024-07-11 — End: 2025-07-11

## 2024-07-11 MED ORDER — SULFACETAMIDE SODIUM-SULFUR 10 %-5 % (W/W) TOPICAL CLEANSER
TOPICAL | 6 refills | Status: AC
Start: 2024-07-11 — End: 2025-07-11

## 2024-07-11 NOTE — Progress Notes (Signed)
 07/11/2024   Last seen 11/10/2022 with Dr. Peterman  Last seen 08/27/2022 by me    I had the pleasure of seeing Taylor Conway, a delightful 33yr female in follow up for the following:      Chief Complaint: The patient's chief concern is facial rash.   History obtained from: Patient    Past Medical History: There is no personal history of skin cancer.   Past Medical History[1]  Past Medical History Pertinent Negatives[2]    Problem List:   Problem List[3]    Meds: Current Medications[4]    Allergies: Allergies[5]    Family History: There is unknown family history of skin cancer.     Social History:   The patient does not smoke or use tobacco products.   Occupation: not provided  She is moving to [place] in August 2025.     Patch Testing 05/30/2022:  Comprehensive series:   # 52, NA71, propolis, 2+     Bakery series:  # 2, HD476, ammonium persulfate, 2+  # 16, AP104, propyl gallate, 1+    Physical Examination:    There were no vitals taken for this visit.     I reviewed the following labs/imaging/records today, 07/11/2024, during patient evaluation:  Dermpath              FINAL DIAGNOSIS   Date Value Ref Range Status   01/03/2022   Final      SKIN, RIGHT SHIN, (PUNCH BIOPSY)   LICHENOID DERMATITIS CONSISTENT WITH LICHEN STRIATUS           A Focused skin exam was done relating to moles/pigmented lesions, skin photodamage.    On physical examination, the patient is well developed, well nourished, and pleasant with normal affect. The patient is alert and oriented to time, place, and person. The following areas were examined today: Head/scalp, face, eyes, eyelids and conjunctiva, nose, lips, and neck    The above areas were inspected and the findings were normal except for the following pertinent abnormal findings listed in the integrated HPI/Exam/Assessment/Plan section below:    Impression/Problems/Plan:    Dx: Perioral Dermatitis - well controlled  HPI: Patient with several month history of bumps around the nose and mouth.  A scraping at prior visit showed demodex in the area and she was given topical metronidazole , ivermectin  and sulfur  wash. This helped significantly. She flared again recently when she ran out of the sulfur  wash; now that she has it again, it is improving.   Interval 07/11/2024: Uses Sulfacetamide  cleanser and Metronidazole  0.75% cream with resolution. Does not use Ivermectin .    PE: clear during today's visit.   Plan:  - Continue Sulfacetamide  cleanser nightly. Refilled today.  - Continue Metronidazole  0.75% cream daily in the morning. Refilled today.  - She is moving to [place] at the end of August therefore prescribed a 6 month supply of all medications refilled during today's visit, refills through the year.     Dx: History of Hives  HPI: History of hives on the body. Inquires about refill of Triamcinolone  0.1% ointment. Endorses some stress from school.   PE: clear during today's visit.   Plan:   - Prescribed Triamcinolone  0.1% ointment to use twice a day as needed on the trunk and extremities. Do not use on the face, groin, or axillae. Do not use for more than 2 weeks out of every month.     Dx: Hair Loss  HPI: Hair loss on the scalp. Symptoms include itchiness and tenderness.  Switched from tighter hairstyles to looser hairstyles. She re-twists her own hair and is mindful of the tension that she applies. Last time she re-twisted her hair was about 1 month ago. Prior treatments include hair growth supplement by [employer], pumpkin oil, and rosemary oil with little to no improvement. Discontinued supplement about 3 months ago. Recently, she reintroduced meat into her diet and discontinued her iron vitamins. Endorses some stress from school. Grandmother has history of traction alopecia.     PE: Healthy-scalp with general thinning. Negative hair pull test. No evidence of scarring.  Plan:   - May use Tacrolimus  0.1% ointment twice a day as needed on the tender and itchy areas on the scalp.   - Recommended to start  Rogaine 5% foam to apply 1/2 cap full to affected areas on the scalp once daily  - Recommended to see Dr. Cherlynn Cella, [facility]. Provided information about hair health in [facility].    - Recommended to complete her Vitamin D  deficiency.  - Recent CMP and CBC wnl.      Photoprotection/skin cancer review:  The nature of sun-induced photo-aging and skin cancers is discussed.  Sun avoidance, protective clothing, and the use of 30-SPF sunscreens is advised. Observe closely for skin damage/changes, and call if such occurs.  Medication Review:   -- I discussed risks, benefits, and proper use of the above dermatological medications with the patient. Patient verbalized understanding of the proper use of dermatological medications and gave verbal consent prior to beginning treatment as outlined above.   Education needs were addressed and there were no barriers to learning.     Patient was made aware of the [facility] chaperone policy.   Chaperone declined by patient.     Hand out provided: hair health    Follow up: PRN - she is moving to [place] at the end of August 2025.     SCRIBE DISCLAIMER:   I, Jasmin Johl, a trained medical scribe, scribed this noted in the presence of Dr. Winton Henry, MD.   Electronically signed by GLENWOOD LEY, 07/11/2024  11:43 AM      PROVIDER DISCLAIMER  I, Winton DELENA Henry, MD, personally performed the services described in this documentation, as scribed by the trained medical scribe above in my presence, and it is both accurate and complete.     Electronically signed by: Winton DELENA Henry, MD -  07/12/2024  4:22 PM           [1]   Past Medical History:  Diagnosis Date    Asthma     Stopped using a steroid inhaler because it was causing an autoimmune response. Has more exercise induced asthma. No hospitalizations or intubations    Contact dermatitis     Seasonal allergies 2007   [2] No past medical history pertinent negatives.  [3]   Patient Active Problem List  Diagnosis    Medication Therapy Auth     Contact dermatitis   [4]   Current Outpatient Medications   Medication Sig Dispense Refill    Cetirizine  (ZYRTEC ) 10 mg Tablet Take 1 tablet by mouth one time. 1 tablet 0    Epinephrine , Anaphylaxis, (EPIPEN ) 0.3 mg/0.3 mL Injection Inject 0.3 mL into the muscle one time if needed for allergic reaction. 2 each 1    Fluticasone  (FLONASE ) 50 mcg/actuation nasal spray Instill 1 spray into EACH nostril every day. 16 g 3    Mometasone  (ELOCON) 0.1 % Ointment Apply sparingly twice a  day as needed to affected areas for up to 10 days 15 g 0    Olopatadine  (PATANOL) 0.1 % Ophthalmic Solution Instill 1 drop into EACH eye two times daily if needed for Allergies. 5 mL 3    Sulfacetamide  Sodium-Sulfur  10-5 % (w/w) Cleanser Apply to the affected area every day. 340 g 6    Tacrolimus  (PROTOPIC ) 0.1 % Ointment Apply to the affected area 2 times daily. to lips 30 g 2    Triamcinolone  (KENALOG ) 0.1 % Ointment Apply to the affected area 2 times daily. to neck 30 g 0     No current facility-administered medications for this visit.   [5]   Allergies  Allergen Reactions    Ammonium Peroxydisulfate Rash     Patch test positive on 05/30/2022 to ammonium persulphate      Diflucan [Fluconazole] Blisters     Vaginal blisters    Honey Rash     lips    Propolis (Bee Glue) Rash     Patch test positive on 05/30/2022       Propyl Gallate Rash     Patch test positive on 05/30/2022

## 2024-07-11 NOTE — Patient Instructions (Signed)
 Recommendations to Optimize Hair Health and Reduce Hair Thinning    Biotin: While biotin supplementation (5000 mcg daily) has been commonly recommended, recent studies suggest that biotin deficiency is rare. Therefore, it may not be necessary for everyone unless you have a known deficiency. Do not take more than 5000 mcg daily as higher doses may lead to inaccuracies in lab results.     Rogaine (Minoxidil) 5% foam for men (safe for use in women): Apply 1/2 cap full to affected areas on the scalp once daily. Results may be seen in 4-6 months, but consistent use is crucial.    Adequate protein intake: Aim for 30-50 grams of protein per day, as protein is essential for hair growth. Discuss dietary needs with your primary doctor.    Regular exercise: Exercise promotes circulation, which can support hair health. Discuss an appropriate exercise regimen with your doctor.    Gentle scalp massage: Massage your scalp for 30 seconds daily to improve blood flow. Avoid using your fingernails to prevent damage.    Oral supplements: Consider Viviscal or Nutrafol, which have been shown in studies to promote hair growth. [employer] Maxi-Hair supplements are another good option.    Vitamin D and Iron: Low levels of vitamin D or iron can contribute to hair loss. Have your levels checked and discuss supplementation with Taylor Conway as needed.       Information on Normal Hair Growth    About 90% of the hair on a person's scalp is growing at any time. The growth phase lasts between two and six years. Ten percent of hair is in a resting phase for two to three months, after which it sheds. A new hair replaces the shed hair, starting the cycle anew.    Scalp hair grows approximately half an inch per month. Hair growth naturally slows with age. Losing 50 to 100 hairs a day is part of the normal hair cycle and not a cause for concern.      Causes of Excessive Hair Loss and Damage    Improper Hair Cosmetic Use/Improper Hair Care  Frequent use  of dyes, bleaches, straighteners, or perms can damage hair if used excessively or incorrectly. Hair weakened from chemical treatments should be allowed to grow out before undergoing more treatments.  Excessively Tight Hairstyles  Tight styles like ponytails or braids can cause traction alopecia (hair loss from tension), especially along the hairline. Alternate tight hairstyles with looser ones to reduce strain on hair follicles.  Hair Breakage from Over-manipulation  Shampooing, combing, and brushing too often can lead to breakage. Use organic coconut oil before washing to strengthen hair and improve manageability. Apply it for 10-15 minutes pre-wash.  Use a cream-based conditioner after shampooing to make hair easier to comb and more manageable. A sulfate-free shampoo is recommended to avoid excessive stripping of natural oils.  Hair is fragile when wet, so avoid vigorous rubbing with a towel or rough combing. Wide-tooth combs and smooth-tip brushes should be used to prevent breakage.    By following these updated guidelines, you can support the health and vitality of your hair.

## 2024-07-11 NOTE — Nursing Note (Signed)
 Identified patient by name and DOB, screened for pain, mobility assessment, reviewed allergies and verified pharmacy.   Vitals not taken per provider's request.     Has patient fallen in the last 6 months:no  Does the patient used assistive devices such as a walker, wheelchair, or cane: no     I verified with patient;  It is okay to leave a detailed message regarding confidential medical information on phone number [phone]).  The patient would like to be notified via MyChart as well.     I notified the patient that if the provider will be performing an exam under their undergarments, a chaperone will be offered during that exam.  The patient has declined a chaperone.  The provider was notified.     Taylor Conway, LVN

## 2024-07-13 ENCOUNTER — Ambulatory Visit: Attending: Family Medicine

## 2024-07-13 DIAGNOSIS — R5383 Other fatigue: Secondary | ICD-10-CM | POA: Insufficient documentation

## 2024-07-13 LAB — VITAMIN D, 25 HYDROXY: Vitamin D, 25 Hydroxy: 27.3 ng/mL (ref 10.0–50.0)

## 2024-08-15 ENCOUNTER — Ambulatory Visit: Payer: Self-pay

## 2024-08-15 ENCOUNTER — Other Ambulatory Visit: Payer: Self-pay

## 2024-08-15 DIAGNOSIS — R202 Paresthesia of skin: Secondary | ICD-10-CM
